# Patient Record
Sex: Male | Born: 1990 | Race: Black or African American | Hispanic: No | Marital: Single | Smoking: Former smoker
Health system: Southern US, Community
[De-identification: ages and names within clinical notes are randomized; demographics above are authoritative.]

---
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
## 2010-06-12 ENCOUNTER — Emergency Department (HOSPITAL_COMMUNITY): Admission: EM | Admit: 2010-06-12 | Discharge: 2010-06-12 | Payer: Self-pay | Admitting: Emergency Medicine

## 2021-03-10 ENCOUNTER — Emergency Department: Payer: BLUE CROSS/BLUE SHIELD

## 2021-03-10 ENCOUNTER — Other Ambulatory Visit: Payer: Self-pay

## 2021-03-10 ENCOUNTER — Emergency Department
Admission: EM | Admit: 2021-03-10 | Discharge: 2021-03-10 | Disposition: A | Discharge status: Home / Self Care | Payer: BLUE CROSS/BLUE SHIELD | Attending: Emergency Medicine | Admitting: Emergency Medicine

## 2021-03-10 ENCOUNTER — Encounter: Payer: Self-pay | Admitting: Emergency Medicine

## 2021-03-10 DIAGNOSIS — M545 Low back pain, unspecified: Secondary | ICD-10-CM | POA: Diagnosis not present

## 2021-03-10 DIAGNOSIS — Z87891 Personal history of nicotine dependence: Secondary | ICD-10-CM | POA: Insufficient documentation

## 2021-03-10 LAB — URINALYSIS, COMPLETE (UACMP) WITH MICROSCOPIC
Bilirubin Urine: NEGATIVE
Glucose, UA: NEGATIVE mg/dL
Hgb urine dipstick: NEGATIVE
Ketones, ur: NEGATIVE mg/dL
Leukocytes,Ua: NEGATIVE
Nitrite: NEGATIVE
Protein, ur: NEGATIVE mg/dL
Specific Gravity, Urine: 1.018 (ref 1.005–1.030)
Squamous Epithelial / LPF: NONE SEEN (ref 0–5)
pH: 6 (ref 5.0–8.0)

## 2021-03-10 MED ORDER — KETOROLAC TROMETHAMINE 30 MG/ML IJ SOLN
30.0000 mg | Freq: Once | INTRAMUSCULAR | Status: AC
  Administered 2021-03-10: 30 mg via INTRAMUSCULAR
  Filled 2021-03-10: qty 1

## 2021-03-10 MED ORDER — ETODOLAC 400 MG PO TABS: 400.0000 mg | ORAL_TABLET | Freq: Two times a day (BID) | ORAL | 0 refills | Status: AC

## 2021-03-10 NOTE — ED Notes (Signed)
See triage note. Pt ambulatory to room. Steady. Pt denies any memorable moment of injury at work. Reports lifts somewhat heavy items in a repetitive motion. Reports upper and lower L-sided back pain. Reports sharp pain in L leg that is "inconsistent and unrelated to the back pain". Pt moves L leg WDL. Denies numbness in L leg. In NAD.

## 2021-03-10 NOTE — ED Triage Notes (Signed)
Pt via POV from home. Pt having generalized back pain since Friday of last week, pt went to [facility] this past [facility] where they d/c him with ibuprofen with no relief. Pt does do strenuous work. Pt is A&Ox4 and NAD.

## 2021-03-10 NOTE — ED Provider Notes (Signed)
[facility] Emergency Department Provider Note  ____________________________________________   Event Date/Time   First MD Initiated Contact with Patient 03/10/21 1143     (approximate)  I have reviewed the triage vital signs and the nursing notes.   HISTORY  Chief Complaint Back Pain   HPI Henry Robinson is a 30 y.o. male presents to the ED with complaint of lower lumbar pain especially to the left side.  Patient states that he does a lot of heavy lifting and does repetitive motion.  He denies any specific injury causing his pain.  Patient states he was seen at urgent care on Friday and was prescribed ibuprofen which has not helped.  Patient rates his pain as 7 out of 10.       No past medical history on file.  There are no problems to display for this patient.     Prior to Admission medications   Medication Sig Start Date End Date Taking? Authorizing Provider  etodolac (LODINE) 400 MG tablet Take 1 tablet (400 mg total) by mouth 2 (two) times daily. 03/10/21  Yes Tommi Rumps, PA-C    Allergies Patient has no known allergies.  No family history on file.  Social History Social History   Tobacco Use  . Smoking status: Former Smoker    Types: Cigarettes  . Smokeless tobacco: Never Used  Vaping Use  . Vaping Use: Every day  Substance Use Topics  . Alcohol use: Not Currently  . Drug use: Not Currently    Review of Systems Constitutional: No fever/chills Eyes: No visual changes. Cardiovascular: Denies chest pain. Respiratory: Denies shortness of breath. Gastrointestinal: No abdominal pain.  No nausea, no vomiting.   Genitourinary: Negative for dysuria. Musculoskeletal: Positive for low back pain. Skin: Negative for rash. Neurological: Negative for headaches, focal weakness or numbness. ____________________________________________   PHYSICAL EXAM:  VITAL SIGNS: ED Triage Vitals [03/10/21 1126]  Enc Vitals Group     BP (!)  148/92     Pulse Rate 74     Resp 20     Temp 98.4 F (36.9 C)     Temp Source Oral     SpO2 100 %     Weight 131 lb (59.4 kg)     Height 5\' 3"  (1.6 m)     Head Circumference      Peak Flow      Pain Score 7     Pain Loc      Pain Edu?      Excl. in GC?     Constitutional: Alert and oriented. Well appearing and in no acute distress. Eyes: Conjunctivae are normal.  Head: Atraumatic. Nose: No congestion/rhinnorhea. Mouth/Throat: Mucous membranes are moist.  Oropharynx non-erythematous. Neck: No stridor.   Cardiovascular: Normal rate, regular rhythm. Grossly normal heart sounds.  Good peripheral circulation. Respiratory: Normal respiratory effort.  No retractions. Lungs CTAB. Gastrointestinal: Soft and nontender. No distention.  Musculoskeletal: Examination of the back there is no gross deformity noted.  There is no tenderness on palpation of thoracic or lumbar spine.  There is bilateral paravertebral muscle tenderness in the lumbar region.  Range of motion is unrestricted.  Patient is able move upper and lower extremities with any difficulty.  Good muscle strength bilaterally.  Patient is able to ambulate without any assistance.   Neurologic:  Normal speech and language.  Reflexes were 2+ bilaterally.  No gross focal neurologic deficits are appreciated. No gait instability. Skin:  Skin is warm, dry  and intact. No rash noted. Psychiatric: Mood and affect are normal. Speech and behavior are normal.  ____________________________________________   LABS (all labs ordered are listed, but only abnormal results are displayed)  Labs Reviewed  URINALYSIS, COMPLETE (UACMP) WITH MICROSCOPIC - Abnormal; Notable for the following components:      Result Value   Color, Urine YELLOW (*)    APPearance CLEAR (*)    Bacteria, UA RARE (*)    All other components within normal limits   ____________________________________________  RADIOLOGY Beaulah Corin, personally viewed and evaluated  these images (plain radiographs) as part of my medical decision making, as well as reviewing the written report by the radiologist.   Official radiology report(s): DG Lumbar Spine 2-3 Views  Result Date: 03/10/2021 CLINICAL DATA:  Pain after lifting heavy object EXAM: LUMBAR SPINE - 2-3 VIEW COMPARISON:  None. FINDINGS: Frontal, lateral, and spot lumbosacral lateral images were obtained. There are 5 non-rib-bearing lumbar type vertebral bodies. There is no fracture or spondylolisthesis. The disc spaces appear normal. No erosive change. IMPRESSION: No fracture or spondylolisthesis.  No evident arthropathy. Electronically Signed   By: Bretta Bang III M.D.   On: 03/10/2021 13:00    ____________________________________________   PROCEDURES  Procedure(s) performed (including Critical Care):  Procedures   ____________________________________________   INITIAL IMPRESSION / ASSESSMENT AND PLAN / ED COURSE  As part of my medical decision making, I reviewed the following data within the electronic MEDICAL RECORD NUMBER Notes from prior ED visits and [place] Controlled Substance Database  At the time of discharge patient denies any pain "at all".  30 year old male presents to the ED with complaint of generalized low back pain for the last 3 days.  He was seen at urgent care on Friday where he was discharged with ibuprofen which he states has not helped with his pain.  He denies any injury to his back.  There is tenderness on palpation of the paravertebral muscles bilaterally on the lumbar area.  No evidence suspicious for injury.  Patient was given Toradol 30 mg IM prior to x-rays.  Patient was made aware that his x-rays were negative for any acute bony changes.  At the time of discharge patient states that he is not having any pain at all.  A prescription for etodolac was sent to his pharmacy and patient will discontinue taking the ibuprofen.  He is to follow-up with his PCP or [facility] acute  care if any continued problems.  ____________________________________________   FINAL CLINICAL IMPRESSION(S) / ED DIAGNOSES  Final diagnoses:  Acute bilateral low back pain without sciatica     ED Discharge Orders         Ordered    etodolac (LODINE) 400 MG tablet  2 times daily        03/10/21 1321          *Please note:  Carr A Hollinger was evaluated in Emergency Department on 03/10/2021 for the symptoms described in the history of present illness. He was evaluated in the context of the global COVID-19 pandemic, which necessitated consideration that the patient might be at risk for infection with the SARS-CoV-2 virus that causes COVID-19. Institutional protocols and algorithms that pertain to the evaluation of patients at risk for COVID-19 are in a state of rapid change based on information released by regulatory bodies including the CDC and federal and state organizations. These policies and algorithms were followed during the patient's care in the ED.  Some ED evaluations and  interventions may be delayed as a result of limited staffing during and the pandemic.*   Note:  This document was prepared using Dragon voice recognition software and may include unintentional dictation errors.    Tommi Rumps, PA-C 03/10/21 1328    Gilles Chiquito, MD 03/10/21 [phone]

## 2021-03-10 NOTE — Discharge Instructions (Signed)
Follow-up with your primary care provider or [facility] acute care if any continued problems.  Discontinue taking the ibuprofen.  A prescription for etodolac was sent to the pharmacy.  This is twice a day with food.

## 2022-07-26 IMAGING — CR DG LUMBAR SPINE 2-3V
1 series · 3 of 3 positions shown · non-contrast
Comparison: None.

CLINICAL DATA: Pain after lifting heavy object

EXAM:
LUMBAR SPINE - 2-3 VIEW

[Series 1: t lumbar spine ap · 0.14mm/px · 3 of 3 slices shown]
[im 1/3]
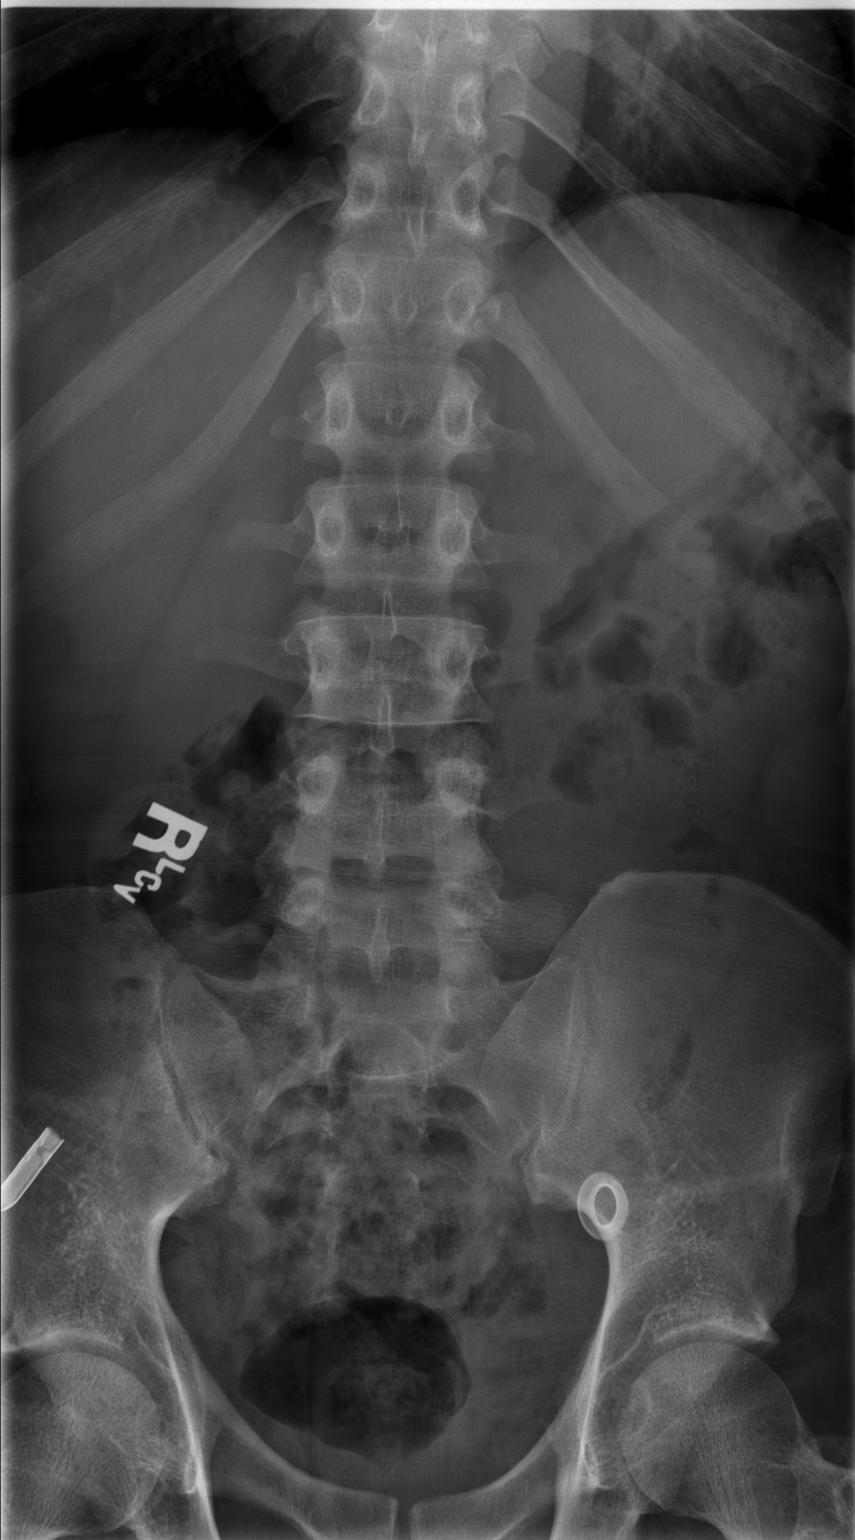
[im 2/3]
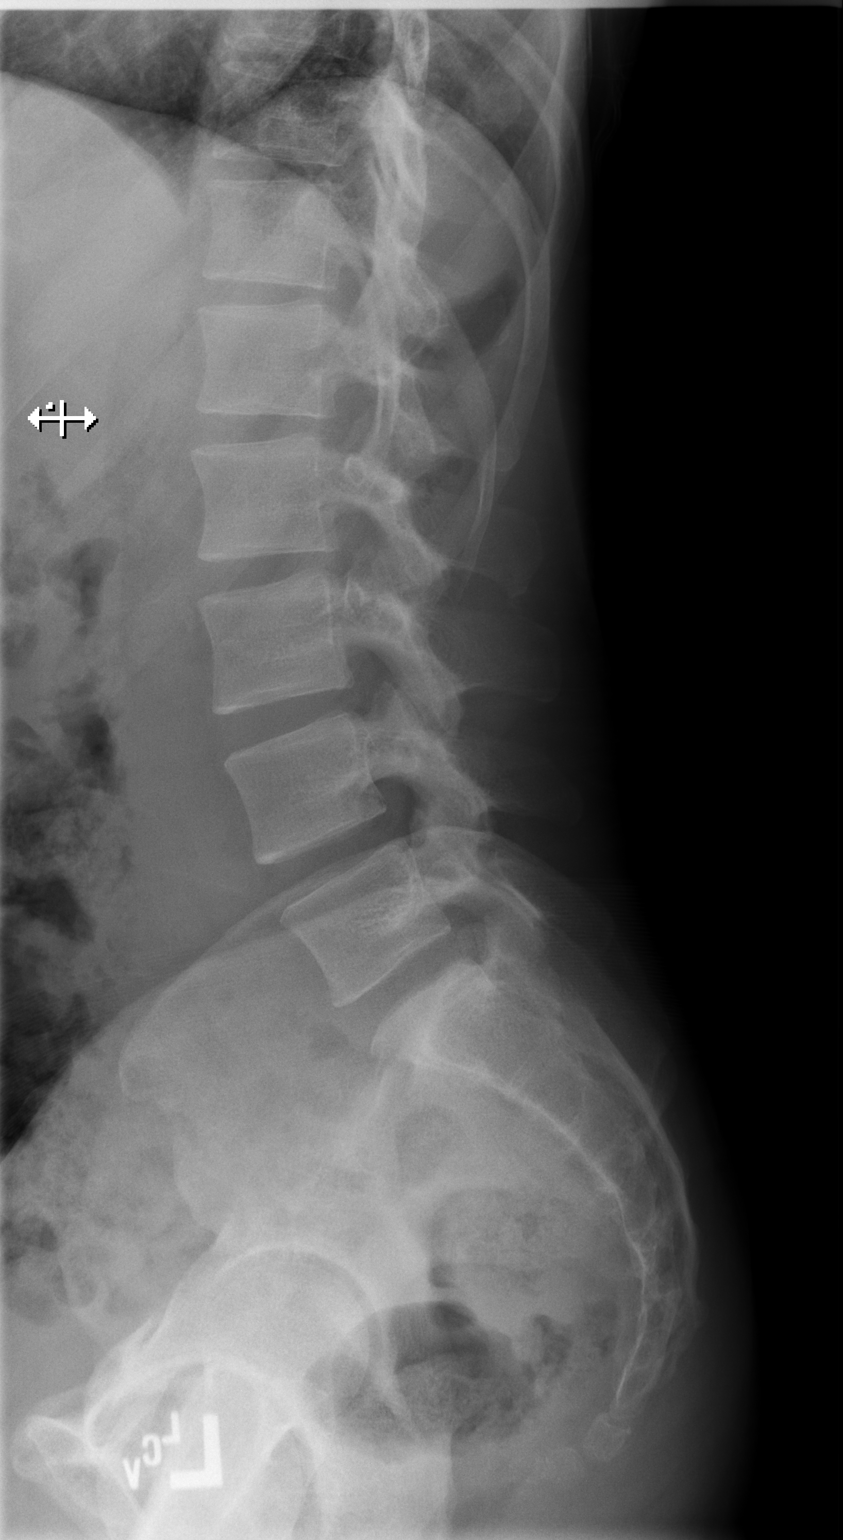
[im 3/3]
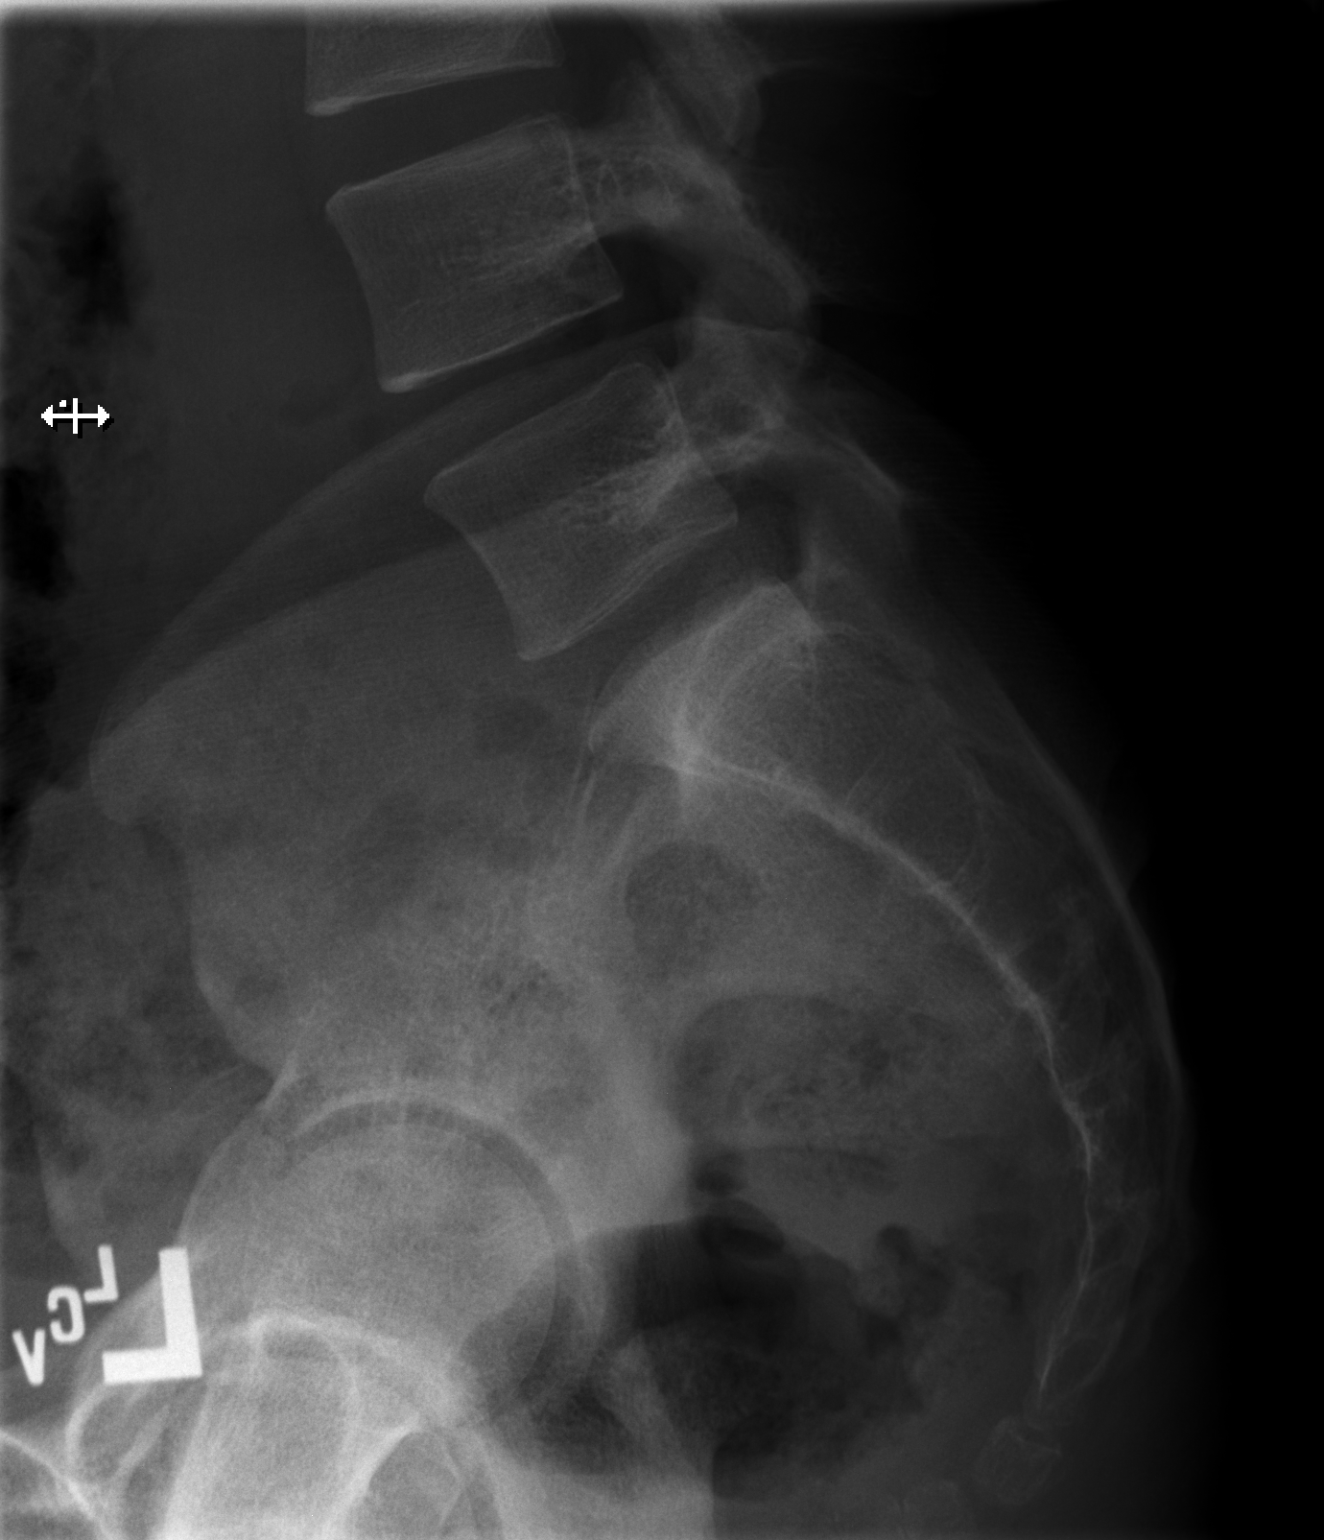

[3 of 3 positions shown; findings below may reference images not displayed]

FINDINGS: Frontal, lateral, and spot lumbosacral lateral images were obtained.
There are 5 non-rib-bearing lumbar type vertebral bodies. There is
no fracture or spondylolisthesis. The disc spaces appear normal. No
erosive change.
IMPRESSION: No fracture or spondylolisthesis.  No evident arthropathy.
